# Patient Record
Sex: Male | Born: 1985 | Race: Black or African American | Hispanic: No | Marital: Single | Smoking: Current some day smoker
Health system: Southern US, Community
[De-identification: ages and names within clinical notes are randomized; demographics above are authoritative.]

## PROBLEM LIST (undated history)

## (undated) DIAGNOSIS — H409 Unspecified glaucoma: Secondary | ICD-10-CM

## (undated) DIAGNOSIS — F32A Depression, unspecified: Secondary | ICD-10-CM

## (undated) DIAGNOSIS — F4310 Post-traumatic stress disorder, unspecified: Secondary | ICD-10-CM

## (undated) HISTORY — PX: APPENDECTOMY: SHX54

---
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
## 2006-02-06 ENCOUNTER — Emergency Department: Payer: Self-pay | Admitting: Emergency Medicine

## 2006-02-06 ENCOUNTER — Other Ambulatory Visit: Payer: Self-pay

## 2006-10-31 ENCOUNTER — Emergency Department: Payer: Self-pay | Admitting: Emergency Medicine

## 2007-02-02 ENCOUNTER — Emergency Department: Payer: Self-pay | Admitting: Emergency Medicine

## 2007-04-19 ENCOUNTER — Emergency Department: Payer: Self-pay | Admitting: Emergency Medicine

## 2007-11-17 ENCOUNTER — Inpatient Hospital Stay: Payer: Self-pay | Admitting: General Surgery

## 2007-12-16 ENCOUNTER — Inpatient Hospital Stay: Payer: Self-pay | Admitting: Psychiatry

## 2008-01-21 ENCOUNTER — Emergency Department: Payer: Self-pay | Admitting: Emergency Medicine

## 2012-01-03 ENCOUNTER — Emergency Department: Payer: Self-pay | Admitting: Emergency Medicine

## 2018-07-29 ENCOUNTER — Emergency Department
Admission: EM | Admit: 2018-07-29 | Discharge: 2018-07-29 | Disposition: A | Discharge status: Home / Self Care | Payer: Self-pay | Attending: Emergency Medicine | Admitting: Emergency Medicine

## 2018-07-29 ENCOUNTER — Other Ambulatory Visit: Payer: Self-pay

## 2018-07-29 ENCOUNTER — Emergency Department: Payer: Self-pay

## 2018-07-29 DIAGNOSIS — Y999 Unspecified external cause status: Secondary | ICD-10-CM | POA: Insufficient documentation

## 2018-07-29 DIAGNOSIS — S41112A Laceration without foreign body of left upper arm, initial encounter: Secondary | ICD-10-CM | POA: Insufficient documentation

## 2018-07-29 DIAGNOSIS — Y939 Activity, unspecified: Secondary | ICD-10-CM | POA: Insufficient documentation

## 2018-07-29 DIAGNOSIS — Y929 Unspecified place or not applicable: Secondary | ICD-10-CM | POA: Insufficient documentation

## 2018-07-29 MED ORDER — TRAMADOL HCL 50 MG PO TABS
50.0000 mg | ORAL_TABLET | Freq: Once | ORAL | Status: AC
  Administered 2018-07-29: 50 mg via ORAL
  Filled 2018-07-29: qty 1

## 2018-07-29 MED ORDER — TRAMADOL HCL 50 MG PO TABS: 50.0000 mg | ORAL_TABLET | Freq: Four times a day (QID) | ORAL | 0 refills | Status: AC | PRN

## 2018-07-29 MED ORDER — LIDOCAINE HCL 1 % IJ SOLN
30.0000 mL | Freq: Once | INTRAMUSCULAR | Status: AC
  Administered 2018-07-29: 30 mL
  Filled 2018-07-29: qty 30

## 2018-07-29 MED ORDER — IOHEXOL 350 MG/ML SOLN
100.0000 mL | Freq: Once | INTRAVENOUS | Status: AC | PRN
  Administered 2018-07-29: 100 mL via INTRAVENOUS
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
## 2018-07-29 NOTE — Discharge Instructions (Addendum)
Please seek medical attention for any high fevers, chest pain, shortness of breath, change in behavior, persistent vomiting, bloody stool or any other new or concerning symptoms.  

## 2018-07-29 NOTE — ED Notes (Signed)
ED Provider at bedside. 

## 2018-07-29 NOTE — ED Notes (Signed)
Clean sterile dressing applied to wound.

## 2018-07-29 NOTE — ED Notes (Signed)
Patient transported to CT 

## 2018-07-29 NOTE — ED Notes (Signed)
Lidocaine administered by MD Derrill Kay

## 2018-07-29 NOTE — ED Notes (Signed)
Reviewed discharge instructions, follow-up care, laceration/wound care, and prescriptions with patient. Patient verbalized understanding of all information reviewed. Patient stable, with no distress noted at this time.

## 2018-07-29 NOTE — ED Notes (Signed)
Two officers remain at bedside.
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
## 2019-04-23 ENCOUNTER — Emergency Department
Admission: EM | Admit: 2019-04-23 | Discharge: 2019-04-23 | Payer: HRSA Program | Attending: Emergency Medicine | Admitting: Emergency Medicine

## 2019-04-23 ENCOUNTER — Encounter: Payer: Self-pay | Admitting: Emergency Medicine

## 2019-04-23 ENCOUNTER — Other Ambulatory Visit: Payer: Self-pay

## 2019-04-23 DIAGNOSIS — Z20828 Contact with and (suspected) exposure to other viral communicable diseases: Secondary | ICD-10-CM | POA: Diagnosis not present

## 2019-04-23 DIAGNOSIS — Z20822 Contact with and (suspected) exposure to covid-19: Secondary | ICD-10-CM

## 2019-04-23 LAB — SARS CORONAVIRUS 2 BY RT PCR (HOSPITAL ORDER, PERFORMED IN ~~LOC~~ HOSPITAL LAB): SARS Coronavirus 2: NEGATIVE

## 2019-04-23 NOTE — ED Notes (Signed)
Pt somewhat agitated at present. Officer in room

## 2019-04-23 NOTE — ED Notes (Signed)
Waiting for covid results per corrections officer
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
## 2019-04-23 NOTE — ED Provider Notes (Signed)
[facility] Emergency Department Provider Note  ____________________________________________  Time seen: Approximately 3:47 PM  I have reviewed the triage vital signs and the nursing notes.   HISTORY  Chief Complaint covid test    HPI Daniel Jacobson is a 33 y.o. male presents to the emergency department for COVID-19 testing.  Patient is currently in custody of [place] police.  Had extensive conversation with patient in exam room and states that he refused COVID testing as he is apprehensive about pain with swabbing.  Explained to patient that COVID-19 testing is only uncomfortable momentarily.  He is calm and cooperative and agreed to testing.        History reviewed. No pertinent past medical history.  There are no active problems to display for this patient.   History reviewed. No pertinent surgical history.  Prior to Admission medications   Medication Sig Start Date End Date Taking? Authorizing Provider  traMADol (ULTRAM) 50 MG tablet Take 1 tablet (50 mg total) by mouth every 6 (six) hours as needed. 07/29/18 07/29/19  Phineas Semen, MD    Allergies Patient has no known allergies.  No family history on file.  Social History Social History   Tobacco Use  . Smoking status: Not on file  Substance Use Topics  . Alcohol use: Not on file  . Drug use: Not on file     Review of Systems  Constitutional: No fever/chills Eyes: No visual changes. No discharge ENT: No upper respiratory complaints. Cardiovascular: no chest pain. Respiratory: no cough. No SOB. Gastrointestinal: No abdominal pain.  No nausea, no vomiting.  No diarrhea.  No constipation. Genitourinary: Negative for dysuria. No hematuria Musculoskeletal: Negative for musculoskeletal pain. Skin: Negative for rash, abrasions, lacerations, ecchymosis. Neurological: Negative for headaches, focal weakness or  numbness.   ____________________________________________   PHYSICAL EXAM:  VITAL SIGNS: ED Triage Vitals  Enc Vitals Group     BP 04/23/19 1507 113/63     Pulse Rate 04/23/19 1507 100     Resp 04/23/19 1507 18     Temp 04/23/19 1507 97.7 F (36.5 C)     Temp Source 04/23/19 1507 Oral     SpO2 04/23/19 1507 97 %     Weight 04/23/19 1508 160 lb (72.6 kg)     Height 04/23/19 1508 6' (1.829 m)     Head Circumference --      Peak Flow --      Pain Score --      Pain Loc --      Pain Edu? --      Excl. in [place]? --      Constitutional: Alert and oriented. Well appearing and in no acute distress. Eyes: Conjunctivae are normal. PERRL. EOMI. Head: Atraumatic. Cardiovascular: Normal rate, regular rhythm. Normal S1 and S2.  Good peripheral circulation. Respiratory: Normal respiratory effort without tachypnea or retractions. Lungs CTAB. Good air entry to the bases with no decreased or absent breath sounds. Gastrointestinal: Bowel sounds 4 quadrants. Soft and nontender to palpation. No guarding or rigidity. No palpable masses. No distention. No CVA tenderness. Musculoskeletal: Full range of motion to all extremities. No gross deformities appreciated. Neurologic:  Normal speech and language. No gross focal neurologic deficits are appreciated.  Skin:  Skin is warm, dry and intact. No rash noted. Psychiatric: Mood and affect are normal. Speech and behavior are normal. Patient exhibits appropriate insight and judgement.   ____________________________________________   LABS (all labs ordered are listed, but only abnormal results are displayed)  Labs Reviewed  SARS CORONAVIRUS 2 (HOSPITAL ORDER, [place] [facility])   ____________________________________________  EKG   ____________________________________________  RADIOLOGY   No results found.  ____________________________________________    PROCEDURES  Procedure(s) performed:     Procedures    Medications - No data to display   ____________________________________________   INITIAL IMPRESSION / ASSESSMENT AND PLAN / ED COURSE  Pertinent labs & imaging results that were available during my care of the patient were reviewed by me and considered in my medical decision making (see chart for details).  Review of the [place] CSRS was performed in accordance of the [place] prior to dispensing any controlled drugs.         Assessment and plan Need for COVID-20 testing 33 year old male presents to the emergency department with a need for COVID-19 testing.  COVID-19 testing occurred in the emergency department.  Patient is not currently symptomatic at this time and is in custody of [place] police.  Return precautions were given.  All patient questions were answered.    ____________________________________________  FINAL CLINICAL IMPRESSION(S) / ED DIAGNOSES  Final diagnoses:  Suspected Covid-19 Virus Infection      NEW MEDICATIONS STARTED DURING THIS VISIT:  ED Discharge Orders    None          This chart was dictated using voice recognition software/Dragon. Despite best efforts to proofread, errors can occur which can change the meaning. Any change was purely unintentional.    Lannie Fields, PA-C 04/23/19 1549    Carrie Mew, MD 04/27/19 [phone]
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
## 2019-06-09 ENCOUNTER — Other Ambulatory Visit: Payer: Self-pay

## 2019-06-09 ENCOUNTER — Ambulatory Visit: Payer: Self-pay | Admitting: Physician Assistant

## 2019-06-09 DIAGNOSIS — Z113 Encounter for screening for infections with a predominantly sexual mode of transmission: Secondary | ICD-10-CM

## 2019-06-09 DIAGNOSIS — Z202 Contact with and (suspected) exposure to infections with a predominantly sexual mode of transmission: Secondary | ICD-10-CM

## 2019-06-10 ENCOUNTER — Encounter: Payer: Self-pay | Admitting: Physician Assistant

## 2019-06-10 MED ORDER — METRONIDAZOLE 500 MG PO TABS: 2000.0000 mg | ORAL_TABLET | Freq: Once | ORAL | 0 refills | Status: AC

## 2019-06-10 NOTE — Progress Notes (Signed)
    STI clinic/screening visit  Subjective:  Daniel Jacobson is a 33 y.o. male being seen today for an STI screening visit. The patient reports they do have symptoms.  Patient has the following medical conditions:  There are no active problems to display for this patient.    Chief Complaint  Patient presents with  . SEXUALLY TRANSMITTED DISEASE    HPI  Patient reports that he is a contact to [place].  Denies symptoms except that he had swollen nodes a few days last week.  Declines screening exam and blood work today.  See flowsheet for further details and programmatic requirements.    The following portions of the patient's history were reviewed and updated as appropriate: allergies, current medications, past medical history, past social history, past surgical history and problem list.  Objective:  There were no vitals filed for this visit.  Physical Exam Constitutional:      General: He is not in acute distress.    Appearance: Normal appearance. He is normal weight.  HENT:     Head: Normocephalic and atraumatic.  Pulmonary:     Effort: Pulmonary effort is normal.  Neurological:     Mental Status: He is alert and oriented to person, place, and time.  Psychiatric:        Mood and Affect: Mood normal.        Behavior: Behavior normal.        Thought Content: Thought content normal.        Judgment: Judgment normal.       Assessment and Plan:  Daniel Jacobson is a 33 y.o. male presenting to the [facility] Department for STI screening  1. Screening for STD (sexually transmitted disease) Patient into clinic without current symptoms.  Declines screening exam and blood work today. Rec condoms with all sex. RTC prn.  2. Venereal disease contact Will treat as a contact to [place] with Metronidazole 2 g po with food, no EtOH for 24 hr before and until 72 hr after completing medicine. No sex for 7 days and until after partner completes treatment. RTC  for re-treatment if vomits < 2 hr after taking medicine. - metroNIDAZOLE (FLAGYL) 500 MG tablet; Take 4 tablets (2,000 mg total) by mouth once for 1 dose.  Dispense: 4 tablet; Refill: 0     No follow-ups on file.  No future appointments.  Jerene Dilling, PA

## 2019-07-17 ENCOUNTER — Other Ambulatory Visit: Payer: Self-pay

## 2019-07-17 ENCOUNTER — Emergency Department
Admission: EM | Admit: 2019-07-17 | Discharge: 2019-07-17 | Disposition: A | Discharge status: Home / Self Care | Payer: Self-pay | Attending: Emergency Medicine | Admitting: Emergency Medicine

## 2019-07-17 ENCOUNTER — Encounter: Payer: Self-pay | Admitting: Emergency Medicine

## 2019-07-17 DIAGNOSIS — F17210 Nicotine dependence, cigarettes, uncomplicated: Secondary | ICD-10-CM | POA: Insufficient documentation

## 2019-07-17 DIAGNOSIS — K029 Dental caries, unspecified: Secondary | ICD-10-CM | POA: Insufficient documentation

## 2019-07-17 DIAGNOSIS — K0889 Other specified disorders of teeth and supporting structures: Secondary | ICD-10-CM

## 2019-07-17 MED ORDER — PENICILLIN V POTASSIUM 500 MG PO TABS: 500.0000 mg | ORAL_TABLET | Freq: Four times a day (QID) | ORAL | 0 refills | Status: AC

## 2019-07-17 NOTE — ED Triage Notes (Signed)
Patient with complaint of left lower back dental pain times one week. Patient reports that he has been taking OTC medications with no relief.

## 2019-07-17 NOTE — ED Provider Notes (Signed)
[facility] Emergency Department Provider Note   ____________________________________________   First MD Initiated Contact with Patient 07/17/19 0116     (approximate)  I have reviewed the triage vital signs and the nursing notes.   HISTORY  Chief Complaint Dental Pain    HPI Daniel Jacobson is a 33 y.o. male with no significant past medical history who presents to the ED complaining of dental pain.  Patient reports he has had 3 weeks of near constant throbbing at the area of his left lower molar.  He describes some mild swelling but has not noticed any drainage from the area and denies any difficulty eating or drinking.  He has not had any difficulty opening his jaw and denies any fevers.  He is requesting the tooth be removed tonight, he has not yet seen a dentist for this issue and does not have a dentist that he follows with regularly.  He has been taking over-the-counter pain medication without relief.        History reviewed. No pertinent past medical history.  There are no problems to display for this patient.   Past Surgical History:  Procedure Laterality Date  . APPENDECTOMY      Prior to Admission medications   Medication Sig Start Date End Date Taking? Authorizing Provider  penicillin v potassium (VEETID) 500 MG tablet Take 1 tablet (500 mg total) by mouth 4 (four) times daily for 7 days. 07/17/19 07/24/19  Blake Divine, MD  traMADol (ULTRAM) 50 MG tablet Take 1 tablet (50 mg total) by mouth every 6 (six) hours as needed. 07/29/18 07/29/19  Nance Pear, MD    Allergies Patient has no known allergies.  No family history on file.  Social History Social History   Tobacco Use  . Smoking status: Current Every Day Smoker    Types: Cigarettes  . Smokeless tobacco: Never Used  Substance Use Topics  . Alcohol use: Yes  . Drug use: Not Currently    Types: Marijuana    Review of Systems  Constitutional: No  fever/chills Eyes: No visual changes. ENT: No sore throat.  Positive for dental pain. Cardiovascular: Denies chest pain. Respiratory: Denies shortness of breath. Gastrointestinal: No abdominal pain.  No nausea, no vomiting.  No diarrhea.  No constipation. Genitourinary: Negative for dysuria. Musculoskeletal: Negative for back pain. Skin: Negative for rash. Neurological: Negative for headaches, focal weakness or numbness.  ____________________________________________   PHYSICAL EXAM:  VITAL SIGNS: ED Triage Vitals [07/17/19 0048]  Enc Vitals Group     BP (!) 144/86     Pulse Rate 85     Resp 18     Temp 98.2 F (36.8 C)     Temp Source Oral     SpO2 100 %     Weight 175 lb (79.4 kg)     Height 6' (1.829 m)     Head Circumference      Peak Flow      Pain Score 10     Pain Loc      Pain Edu?      Excl. in [place]?     Constitutional: Alert and oriented. Eyes: Conjunctivae are normal. Head: Atraumatic. Nose: No congestion/rhinnorhea. Mouth/Throat: Mucous membranes are moist.  Poor dentition with dental caries noted to left lower molar and mild surrounding erythema and edema without focal area of fluctuance.  Submandibular compartments soft, no trismus. Neck: Normal ROM Cardiovascular: Normal rate, regular rhythm. Grossly normal heart sounds. Respiratory: Normal respiratory effort.  No retractions. Lungs CTAB. Gastrointestinal: Soft and nontender. No distention. Genitourinary: deferred Musculoskeletal: No lower extremity tenderness nor edema. Neurologic:  Normal speech and language. No gross focal neurologic deficits are appreciated. Skin:  Skin is warm, dry and intact. No rash noted. Psychiatric: Mood and affect are normal. Speech and behavior are normal.  ____________________________________________   LABS (all labs ordered are listed, but only abnormal results are displayed)  Labs Reviewed - No data to display   PROCEDURES  Procedure(s) performed (including  Critical Care):  Procedures   ____________________________________________   INITIAL IMPRESSION / ASSESSMENT AND PLAN / ED COURSE       33 year old male presents to the ED with 3 weeks of pain in the area of his left lower molar.  He has no trismus or significant edema, no evidence of [place]'s angina.  He also does not have any findings to suggest abscess, but there does appear to be infection developing around the left lower molar.  Patient was informed that there is no indication for tooth removal tonight here in the ED and that he will need to follow-up with a dentist.  Will prescribe antibiotics and patient was given information on options for local dentist.  Counseled to complete all antibiotics and to follow-up with dentistry, otherwise return to the ED for new or worsening symptoms.  Patient agrees with plan.      ____________________________________________   FINAL CLINICAL IMPRESSION(S) / ED DIAGNOSES  Final diagnoses:  Dental caries  Dentalgia     ED Discharge Orders         Ordered    penicillin v potassium (VEETID) 500 MG tablet  4 times daily     07/17/19 0135           Note:  This document was prepared using Dragon voice recognition software and may include unintentional dictation errors.   Chesley Noon, MD 07/17/19 [phone]

## 2019-07-17 NOTE — ED Notes (Signed)
Reviewed discharge instructions, follow-up care, and prescriptions with patient. Patient verbalized understanding of all information reviewed. Patient stable, with no distress noted at this time.    

## 2019-07-17 NOTE — ED Notes (Signed)
Patient c/o upper/lower wisdom tooth pain and swelling.

## 2019-07-17 NOTE — ED Notes (Signed)
Patient's girlfriend Pearline Cables [phone] and mother Cobey Raineri [phone].

## 2019-12-06 ENCOUNTER — Other Ambulatory Visit: Payer: Self-pay

## 2019-12-06 ENCOUNTER — Encounter: Payer: Self-pay | Admitting: Emergency Medicine

## 2019-12-06 ENCOUNTER — Emergency Department
Admission: EM | Admit: 2019-12-06 | Discharge: 2019-12-06 | Disposition: A | Discharge status: Home / Self Care | Payer: Self-pay | Attending: Emergency Medicine | Admitting: Emergency Medicine

## 2019-12-06 DIAGNOSIS — H409 Unspecified glaucoma: Secondary | ICD-10-CM | POA: Insufficient documentation

## 2019-12-06 DIAGNOSIS — F17210 Nicotine dependence, cigarettes, uncomplicated: Secondary | ICD-10-CM | POA: Insufficient documentation

## 2019-12-06 DIAGNOSIS — Z760 Encounter for issue of repeat prescription: Secondary | ICD-10-CM | POA: Insufficient documentation

## 2019-12-06 HISTORY — DX: Unspecified glaucoma: H40.9

## 2019-12-06 MED ORDER — DORZOLAMIDE HCL-TIMOLOL MAL 2-0.5 % OP SOLN: 1.0000 [drp] | Freq: Two times a day (BID) | OPHTHALMIC | 1 refills | Status: AC

## 2019-12-06 NOTE — ED Notes (Signed)
Pt states that he has glaucoma and that he hasn't taken his meds in two months. Pt states his eyes are sensitive to light and that they are sore. Pt states he also has some blurry vision.

## 2019-12-06 NOTE — ED Provider Notes (Signed)
Emergency Department Provider Note  ____________________________________________  Time seen: Approximately 9:01 PM  I have reviewed the triage vital signs and the nursing notes.   HISTORY  Chief Complaint Eye Problem   Historian Patient     HPI Daniel Jacobson is a 34 y.o. male presents to the emergency department for a refill of his glaucoma medication.  Patient states that he uses topical eye medications for his glaucoma and is running low but is not completely out.  He denies changes in vision or eye pain.  Patient states that he has some chronic photophobia but no new symptoms.  Patient states that he takes dorzolamide and timolol combination medication.  No other alleviating measures have been attempted.   Past Medical History:  Diagnosis Date  . Glaucoma      Immunizations up to date:  Yes.     Past Medical History:  Diagnosis Date  . Glaucoma     There are no problems to display for this patient.   Past Surgical History:  Procedure Laterality Date  . APPENDECTOMY      Prior to Admission medications   Medication Sig Start Date End Date Taking? Authorizing Provider  dorzolamide-timolol (COSOPT) 22.3-6.8 MG/ML ophthalmic solution Place 1 drop into both eyes 2 (two) times daily. 12/06/19 12/05/20  Orvil Feil, PA-C    Allergies Patient has no known allergies.  No family history on file.  Social History Social History   Tobacco Use  . Smoking status: Current Every Day Smoker    Types: Cigarettes  . Smokeless tobacco: Never Used  Substance Use Topics  . Alcohol use: Yes  . Drug use: Not Currently    Types: Marijuana     Review of Systems  Constitutional: No fever/chills Eyes:  No discharge ENT: No upper respiratory complaints. Respiratory: no cough. No SOB/ use of accessory muscles to breath Gastrointestinal:   No nausea, no vomiting.  No diarrhea.  No constipation. Musculoskeletal: Negative for musculoskeletal pain. Skin:  Negative for rash, abrasions, lacerations, ecchymosis.    ____________________________________________   PHYSICAL EXAM:  VITAL SIGNS: ED Triage Vitals  Enc Vitals Group     BP 12/06/19 1930 132/72     Pulse Rate 12/06/19 1930 96     Resp 12/06/19 1930 18     Temp 12/06/19 1930 98.5 F (36.9 C)     Temp Source 12/06/19 1930 Oral     SpO2 --      Weight 12/06/19 1931 175 lb (79.4 kg)     Height 12/06/19 1931 6' (1.829 m)     Head Circumference --      Peak Flow --      Pain Score 12/06/19 1931 8     Pain Loc --      Pain Edu? --      Excl. in GC? --      Constitutional: Alert and oriented. Well appearing and in no acute distress. Eyes: Conjunctivae are normal. PERRL. EOMI. Head: Atraumatic. ENT:      Nose: No congestion/rhinnorhea.      Mouth/Throat: Mucous membranes are moist.  Neck: No stridor.  No cervical spine tenderness to palpation.  Cardiovascular: Normal rate, regular rhythm. Normal S1 and S2.  Good peripheral circulation. Respiratory: Normal respiratory effort without tachypnea or retractions. Lungs CTAB. Good air entry to the bases with no decreased or absent breath sounds Gastrointestinal: Bowel sounds x 4 quadrants. Soft and nontender to palpation. No guarding or rigidity. No distention. Musculoskeletal: Full range of motion  to all extremities. No obvious deformities noted Neurologic:  Normal for age. No gross focal neurologic deficits are appreciated.  Skin:  Skin is warm, dry and intact. No rash noted. Psychiatric: Mood and affect are normal for age. Speech and behavior are normal.   ____________________________________________   LABS (all labs ordered are listed, but only abnormal results are displayed)  Labs Reviewed - No data to display ____________________________________________  EKG   ____________________________________________  RADIOLOGY   No results  found.  ____________________________________________    PROCEDURES  Procedure(s) performed:     Procedures     Medications - No data to display   ____________________________________________   INITIAL IMPRESSION / ASSESSMENT AND PLAN / ED COURSE  Pertinent labs & imaging results that were available during my care of the patient were reviewed by me and considered in my medical decision making (see chart for details).      Assessment and plan Medication refill 34 year old male presents to the emergency department for medication refill.  A refill of patients Cosopt was given during this emergency department encounter.  Patient was advised to follow-up with ophthalmology as needed.  Return precautions were given to return with new or worsening symptoms    ____________________________________________  FINAL CLINICAL IMPRESSION(S) / ED DIAGNOSES  Final diagnoses:  Medication refill      NEW MEDICATIONS STARTED DURING THIS VISIT:  ED Discharge Orders         Ordered    dorzolamide-timolol (COSOPT) 22.3-6.8 MG/ML ophthalmic solution  2 times daily     12/06/19 2105              This chart was dictated using voice recognition software/Dragon. Despite best efforts to proofread, errors can occur which can change the meaning. Any change was purely unintentional.     Karren Cobble 12/06/19 2112    Nance Pear, MD 12/06/19 2122

## 2019-12-06 NOTE — ED Triage Notes (Addendum)
Patient ambulatory to triage with steady gait, without difficulty or distress noted, mask in place; bilat eye pain & sensitivity; st ran out of his glaucoma medicine 69mos ago due to be incarcerated; pt also c/o right shoulder pain since getting an injury in prison; pain with ROM; visual acuity 20/30 rt eye 20/25 left eye

## 2020-01-09 ENCOUNTER — Emergency Department (HOSPITAL_COMMUNITY): Payer: Self-pay

## 2020-01-09 ENCOUNTER — Encounter (HOSPITAL_COMMUNITY): Payer: Self-pay | Admitting: Emergency Medicine

## 2020-01-09 ENCOUNTER — Emergency Department (HOSPITAL_COMMUNITY)
Admission: EM | Admit: 2020-01-09 | Discharge: 2020-01-09 | Disposition: A | Discharge status: Home / Self Care | Payer: Self-pay | Attending: Emergency Medicine | Admitting: Emergency Medicine

## 2020-01-09 DIAGNOSIS — T07XXXA Unspecified multiple injuries, initial encounter: Secondary | ICD-10-CM

## 2020-01-09 DIAGNOSIS — S51812A Laceration without foreign body of left forearm, initial encounter: Secondary | ICD-10-CM | POA: Insufficient documentation

## 2020-01-09 DIAGNOSIS — F17210 Nicotine dependence, cigarettes, uncomplicated: Secondary | ICD-10-CM | POA: Insufficient documentation

## 2020-01-09 DIAGNOSIS — W2209XA Striking against other stationary object, initial encounter: Secondary | ICD-10-CM | POA: Insufficient documentation

## 2020-01-09 DIAGNOSIS — Y929 Unspecified place or not applicable: Secondary | ICD-10-CM | POA: Insufficient documentation

## 2020-01-09 DIAGNOSIS — Y9339 Activity, other involving climbing, rappelling and jumping off: Secondary | ICD-10-CM | POA: Insufficient documentation

## 2020-01-09 DIAGNOSIS — Y999 Unspecified external cause status: Secondary | ICD-10-CM | POA: Insufficient documentation

## 2020-01-09 HISTORY — DX: Post-traumatic stress disorder, unspecified: F43.10

## 2020-01-09 HISTORY — DX: Depression, unspecified: F32.A

## 2020-01-09 LAB — BASIC METABOLIC PANEL
Anion gap: 17 — ABNORMAL HIGH (ref 5–15)
BUN: 5 mg/dL — ABNORMAL LOW (ref 6–20)
CO2: 19 mmol/L — ABNORMAL LOW (ref 22–32)
Calcium: 9.2 mg/dL (ref 8.9–10.3)
Chloride: 102 mmol/L (ref 98–111)
Creatinine, Ser: 0.85 mg/dL (ref 0.61–1.24)
GFR calc Af Amer: >60 mL/min (ref >60)
GFR calc non Af Amer: >60 mL/min (ref >60)
Glucose, Bld: 109 mg/dL — ABNORMAL HIGH (ref 70–99)
Potassium: 3.1 mmol/L — ABNORMAL LOW (ref 3.5–5.1)
Sodium: 138 mmol/L (ref 135–145)

## 2020-01-09 LAB — CBC
HCT: 45.6 % (ref 39.0–52.0)
Hemoglobin: 15.9 g/dL (ref 13.0–17.0)
MCH: 33.5 pg (ref 26.0–34.0)
MCHC: 34.9 g/dL (ref 30.0–36.0)
MCV: 96.2 fL (ref 80.0–100.0)
Platelets: 153 10*3/uL (ref 150–400)
RBC: 4.74 MIL/uL (ref 4.22–5.81)
RDW: 11.7 % (ref 11.5–15.5)
WBC: 6.9 10*3/uL (ref 4.0–10.5)
nRBC: 0 % (ref 0.0–0.2)

## 2020-01-09 MED ORDER — CEFAZOLIN SODIUM-DEXTROSE 2-4 GM/100ML-% IV SOLN
2.0000 g | Freq: Once | INTRAVENOUS | Status: AC
  Administered 2020-01-09: 2 g via INTRAVENOUS

## 2020-01-09 MED ORDER — LIDOCAINE-EPINEPHRINE (PF) 2 %-1:200000 IJ SOLN
INTRAMUSCULAR | Status: AC
  Filled 2020-01-09: qty 20

## 2020-01-09 MED ORDER — AMOXICILLIN-POT CLAVULANATE 875-125 MG PO TABS
1.0000 | ORAL_TABLET | Freq: Two times a day (BID) | ORAL | 0 refills | Status: DC
Start: 2020-01-09 — End: 2020-01-09

## 2020-01-09 MED ORDER — AMOXICILLIN-POT CLAVULANATE 875-125 MG PO TABS: 1.0000 | ORAL_TABLET | Freq: Two times a day (BID) | ORAL | 0 refills | Status: AC

## 2020-01-09 NOTE — ED Notes (Signed)
Pt to ED via [facility] after reported being chased by the police and trying to jump a fence.  Pt cut his left forearm on wire on top of fence.  Police dept placed a tourniquet on left upper arm.  EMS st's arm continued to bleed and they applied more dressing to arm at 18:25.  Bleeding controlled on arrival to ED.  EMS also reports pt was uncooperative enroute and they gave him Haldol 10mg  IV and Versed 2mg  IV.  Pt alert and oriented on arrival to ED and c/o severe pain to left arm.

## 2020-01-09 NOTE — ED Notes (Signed)
Dr. Mack Hook at bedside suturing wound

## 2020-01-09 NOTE — ED Provider Notes (Signed)
[facility] EMERGENCY DEPARTMENT Provider Note   CSN: [id number] Arrival date & time: 01/09/20  1845     History No chief complaint on file.   Daniel Jacobson is a 34 y.o. male.  34 year old male who presents with lacerations.  Just prior to arrival, the patient was being chased by police and jumped over a fence, sustaining a large laceration to his left forearm as well as multiple other small lacerations elsewhere.  A CAT tourniquet was placed by police on L forearm for bleeding, taken down then replaced again by EMS. VSS during transport. Pt's primary complaint is severe pain from tourniquet. He states tdap UTD.  The history is provided by the patient and the police.       Past Medical History:  Diagnosis Date  . Depression   . PTSD (post-traumatic stress disorder)     There are no problems to display for this patient.   History reviewed. No pertinent surgical history.     No family history on file.  Social History   Tobacco Use  . Smoking status: Current Some Day Smoker  . Smokeless tobacco: Never Used  Substance Use Topics  . Alcohol use: Yes  . Drug use: Never    Home Medications Prior to Admission medications   Medication Sig Start Date End Date Taking? Authorizing Provider  amoxicillin-clavulanate (AUGMENTIN) 875-125 MG tablet Take 1 tablet by mouth 2 (two) times daily for 4 days. 01/09/20 01/13/20  Mack Hook, MD    Allergies    Patient has no allergy information on record. denies Review of Systems   Review of Systems All other systems reviewed and are negative except that which was mentioned in HPI  Physical Exam Updated Vital Signs BP (!) 134/97   Pulse 90   Temp 97.7 F (36.5 C) (Oral)   Resp 15   Ht 6' (1.829 m)   Wt 79.4 kg   SpO2 94%   BMI 23.73 kg/m   Physical Exam Vitals and nursing note reviewed.  Constitutional:      General: He is not in acute distress.    Appearance: He is well-developed.      Comments: anxious  HENT:     Head: Normocephalic and atraumatic.  Eyes:     Conjunctiva/sclera: Conjunctivae normal.  Cardiovascular:     Rate and Rhythm: Normal rate and regular rhythm.     Heart sounds: Normal heart sounds. No murmur heard.   Pulmonary:     Effort: Pulmonary effort is normal.     Breath sounds: Normal breath sounds.  Abdominal:     General: There is no distension.     Palpations: Abdomen is soft.     Tenderness: There is no abdominal tenderness.  Musculoskeletal:        General: Normal range of motion.     Cervical back: Neck supple.  Skin:    General: Skin is warm and dry.     Comments: Large 10cm laceration on volar L forearm with muscle exposure, a few small venules actively bleeding but no pulsatile bleeding, CAT tourniquet on proximal L arm, scattered smaller lacerations on L upper arm, R upper arm, L forearm; scattered abrasions and superficial small lacs on anterior chest and L lateral side none of which are deep  Neurological:     Mental Status: He is alert and oriented to person, place, and time.     Comments: Fluent speech  Psychiatric:        Judgment: Judgment  normal.         ED Results / Procedures / Treatments   Labs (all labs ordered are listed, but only abnormal results are displayed) Labs Reviewed  BASIC METABOLIC PANEL - Abnormal; Notable for the following components:      Result Value   Potassium 3.1 (*)    CO2 19 (*)    Glucose, Bld 109 (*)    BUN 5 (*)    Anion gap 17 (*)    All other components within normal limits  SARS CORONAVIRUS 2 BY RT PCR (HOSPITAL ORDER, PERFORMED IN [facility])  CBC    EKG None  Radiology DG Forearm Left  Result Date: 01/09/2020 CLINICAL DATA:  Deep laceration to forearm after jumping over fence. EXAM: LEFT FOREARM - 2 VIEW COMPARISON:  None. FINDINGS: Soft tissue laceration with overlying bandage material noted involving the medial aspect of the proximal forearm. The underlying  osseous structures are intact. No fracture or dislocation. IMPRESSION: 1. No acute bone abnormality. 2. Soft tissue laceration involving the medial aspect of the proximal forearm. Electronically Signed   By: Signa Kell M.D.   On: 01/09/2020 19:43    Procedures .Marland KitchenLaceration Repair  Date/Time: 01/09/2020 8:04 PM Performed by: Laurence Spates, MD Authorized by: Laurence Spates, MD   Consent:    Consent obtained:  Verbal   Consent given by:  Patient Anesthesia (see MAR for exact dosages):    Anesthesia method:  None Laceration details:    Location:  Shoulder/arm   Shoulder/arm location:  L lower arm   Length (cm):  10 Repair type:    Repair type:  Intermediate Subcutaneous repair:    Suture size:  4-0   Suture material:  Vicryl   Suture technique:  Figure eight   Number of sutures:  2 Post-procedure details:    Dressing:  Bulky dressing   Patient tolerance of procedure:  Tolerated well, no immediate complications Comments:     Placed 2 subcutaneous figure-of-eight sutures to stop small areas of bleeding; hemostasis acheived   (including critical care time)  Medications Ordered in ED Medications  lidocaine-EPINEPHrine (XYLOCAINE W/EPI) 2 %-1:200000 (PF) injection (has no administration in time range)  ceFAZolin (ANCEF) IVPB 2g/100 mL premix (2 g Intravenous New Bag/Given 01/09/20 2042)    ED Course  I have reviewed the triage vital signs and the nursing notes.  Pertinent labs & imaging results that were available during my care of the patient were reviewed by me and considered in my medical decision making (see chart for details).    MDM Rules/Calculators/A&P                          Pt alert, in distress due to pain from tourniquet. VSS. On exam, large laceration on volar L forearm w/ venous bleeding but no obvious arterial injury. Placed a few vicryl sutures for hemostasis then wrapped in combat gauze w/ compressive dressing. 2+ radial pulse after CAT  tourniquet taken down. Pt states tdap UTD. Forearm XR negative for foreign body or bony injury.    Given extent of wound, contacted hand surgeon on call, Dr. Janee Morn. I appreciate his assistance with the patient's care. He repaired wound at bedside. Has given ancef here and prescribed augmentin at discharge. I discussed wound care w/ the patient and return precautions regarding signs/sx of infection. He voiced understanding. Pt discharged to police custody.  Final Clinical Impression(s) / ED Diagnoses Final diagnoses:  None    Rx / DC Orders ED Discharge Orders         Ordered    amoxicillin-clavulanate (AUGMENTIN) 875-125 MG tablet  2 times daily     Discontinue  Reprint     01/09/20 2109           Lyzette Reinhardt, Wenda Overland, MD 01/09/20 2118

## 2020-01-09 NOTE — Progress Notes (Signed)
Orthopedic Tech Progress Note Patient Details:  Daniel Jacobson February 22, 1986 [id number] Level 1 trauma downgraded to level 2 Patient ID: Daniel Jacobson, male   DOB: Jun 06, 1986, 34 y.o.   MRN: [id number]   [name] 01/09/2020, 7:17 PM

## 2020-01-09 NOTE — Discharge Instructions (Addendum)
I would like for him to be on Augmentin, 875 BID for 4 days Keep bandages on wounds for 48 hours.    Then, 1. may d/c dressings and only need to dress if wounds are draining. 2. May cleanse wounds with soap and running water in shower 3. D/c staples in 10-15 days if wound appears appropriate.  If he is no longer in custody, then we are happy to remove them at our office.  Call for appt.

## 2020-01-09 NOTE — Consult Note (Signed)
ORTHOPAEDIC CONSULTATION HISTORY & PHYSICAL REQUESTING PHYSICIAN: Little, Wenda Overland, MD  Chief Complaint: L FA lacerations  HPI: Daniel Jacobson is a 34 y.o. male who was reportedly eluding police when he sustained multiple lacerations from a fence.  He was initially evaluated as a level 1 trauma then downgraded.  Hemostasis has been obtained.  I was consulted for management of the larger left volar forearm laceration.  Past Medical History:  Diagnosis Date  . Depression   . PTSD (post-traumatic stress disorder)    History reviewed. No pertinent surgical history. Social History   Socioeconomic History  . Marital status: Single    Spouse name: Not on file  . Number of children: Not on file  . Years of education: Not on file  . Highest education level: Not on file  Occupational History  . Not on file  Tobacco Use  . Smoking status: Current Some Day Smoker  . Smokeless tobacco: Never Used  Substance and Sexual Activity  . Alcohol use: Yes  . Drug use: Never  . Sexual activity: Not on file  Other Topics Concern  . Not on file  Social History Narrative  . Not on file   Social Determinants of Health   Financial Resource Strain:   . Difficulty of Paying Living Expenses:   Food Insecurity:   . Worried About Charity fundraiser in the Last Year:   . Arboriculturist in the Last Year:   Transportation Needs:   . Film/video editor (Medical):   Marland Kitchen Lack of Transportation (Non-Medical):   Physical Activity:   . Days of Exercise per Week:   . Minutes of Exercise per Session:   Stress:   . Feeling of Stress :   Social Connections:   . Frequency of Communication with Friends and Family:   . Frequency of Social Gatherings with Friends and Family:   . Attends Religious Services:   . Active Member of Clubs or Organizations:   . Attends Archivist Meetings:   Marland Kitchen Marital Status:    No family history on file. Not on File Prior to Admission medications     Not on File   DG Forearm Left  Result Date: 01/09/2020 CLINICAL DATA:  Deep laceration to forearm after jumping over fence. EXAM: LEFT FOREARM - 2 VIEW COMPARISON:  None. FINDINGS: Soft tissue laceration with overlying bandage material noted involving the medial aspect of the proximal forearm. The underlying osseous structures are intact. No fracture or dislocation. IMPRESSION: 1. No acute bone abnormality. 2. Soft tissue laceration involving the medial aspect of the proximal forearm. Electronically Signed   By: Kerby Moors M.D.   On: 01/09/2020 19:43    Positive ROS: All other systems have been reviewed and were otherwise negative with the exception of those mentioned in the HPI and as above.  Physical Exam: Vitals: Refer to EMR. Constitutional:  WD, WN, NAD, resting with eyes closed, but will follow commands HEENT:  NCAT, EOMI Neuro/Psych: Following commands Lymphatic: No generalized extremity edema or lymphadenopathy Extremities / MSK:  The extremities are normal with respect to appearance, ranges of motion, joint stability, muscle strength/tone, sensation, & perfusion except as otherwise noted:  There is a linear laceration on the volar aspect of the proximal forearm on the left.  It is a slight Y-shaped, measuring about 15 cm of laceration total.  It is through the skin and subcutaneous tissues, and one portion of it has breached the fascia exposing the underlying  muscle which appears clean, red and well perfused and contractile.  The wound itself is fairly clean.  There is a couple of adjacent lacerations measuring 2 cm each proximal to this that only extend in the subcutaneous plane, as well as a similar one on the posterior aspect of the upper arm, about 3 cm.  Altogether there is 22 cm.   FDS and FDP are intact to all the digits to testing, as is FPL.  He retains wrist flexion extension.  Intact light touch sensibility on the volar aspect of the small finger, the index finger, and the  dorsal first webspace.  Assessment: Large relatively shallow lacerations of the left upper extremity, without significant muscular injury  Plan/Procedure: I reviewed these findings with him and gained verbal consent to proceed with local anesthesia, wound irrigation and closure.  Lidocaine with epinephrine was instilled at the periphery of the wounds, 20 mL total.  Once adequately anesthetized, the wounds were copiously irrigated with saline and a touch of peroxide.  The wounds were then prepped with Betadine and draped and some jagged skin edges of the major wound were debrided sharply with scissors and forceps.  The wounds were then closed with staples.  A light dressing was applied after the skin of the arm was cleaned of dried blood, etc. He reports his tetanus is up-to-date.  I ensured he received 2 g of Ancef in the ED.  It is my understanding he will be discharged in police custody to the magistrate, and beyond that is unclear.  I would like for him to be on 4 days of Augmentin if possible and the staples can be removed in 10 to 15 days of the wounds appear appropriate.  If he is no longer in custody, were happy to do so in the office.  I have written such in his discharge instructions.  Cliffton Asters Janee Morn, MD      Orthopaedic & Hand Surgery [facility] [address] [place], [place]  [address] Office: [phone] Mobile: [phone]  01/09/2020, 9:06 PM

## 2020-01-10 ENCOUNTER — Encounter: Payer: Self-pay | Admitting: Emergency Medicine

## 2020-07-31 ENCOUNTER — Emergency Department: Payer: HRSA Program

## 2020-07-31 ENCOUNTER — Emergency Department
Admission: EM | Admit: 2020-07-31 | Discharge: 2020-07-31 | Discharge status: Against Medical Advice | Payer: HRSA Program | Attending: Emergency Medicine | Admitting: Emergency Medicine

## 2020-07-31 ENCOUNTER — Other Ambulatory Visit: Payer: Self-pay

## 2020-07-31 DIAGNOSIS — Z9049 Acquired absence of other specified parts of digestive tract: Secondary | ICD-10-CM | POA: Insufficient documentation

## 2020-07-31 DIAGNOSIS — R1110 Vomiting, unspecified: Secondary | ICD-10-CM | POA: Diagnosis present

## 2020-07-31 DIAGNOSIS — U071 COVID-19: Secondary | ICD-10-CM | POA: Insufficient documentation

## 2020-07-31 DIAGNOSIS — F17200 Nicotine dependence, unspecified, uncomplicated: Secondary | ICD-10-CM | POA: Insufficient documentation

## 2020-07-31 DIAGNOSIS — R112 Nausea with vomiting, unspecified: Secondary | ICD-10-CM | POA: Insufficient documentation

## 2020-07-31 DIAGNOSIS — E860 Dehydration: Secondary | ICD-10-CM | POA: Insufficient documentation

## 2020-07-31 LAB — COMPREHENSIVE METABOLIC PANEL
ALT: 34 U/L (ref 0–44)
AST: 34 U/L (ref 15–41)
Albumin: 4.5 g/dL (ref 3.5–5.0)
Alkaline Phosphatase: 52 U/L (ref 38–126)
Anion gap: 11 (ref 5–15)
BUN: 9 mg/dL (ref 6–20)
CO2: 31 mmol/L (ref 22–32)
Calcium: 9.7 mg/dL (ref 8.9–10.3)
Chloride: 90 mmol/L — ABNORMAL LOW (ref 98–111)
Creatinine, Ser: 1.08 mg/dL (ref 0.61–1.24)
GFR, Estimated: >60 mL/min (ref >60)
Glucose, Bld: 119 mg/dL — ABNORMAL HIGH (ref 70–99)
Potassium: 3.6 mmol/L (ref 3.5–5.1)
Sodium: 132 mmol/L — ABNORMAL LOW (ref 135–145)
Total Bilirubin: 2.2 mg/dL — ABNORMAL HIGH (ref 0.3–1.2)
Total Protein: 8.2 g/dL — ABNORMAL HIGH (ref 6.5–8.1)

## 2020-07-31 LAB — CBC
HCT: 53.5 % — ABNORMAL HIGH (ref 39.0–52.0)
Hemoglobin: 19.4 g/dL — ABNORMAL HIGH (ref 13.0–17.0)
MCH: 33.3 pg (ref 26.0–34.0)
MCHC: 36.3 g/dL — ABNORMAL HIGH (ref 30.0–36.0)
MCV: 91.9 fL (ref 80.0–100.0)
Platelets: 142 10*3/uL — ABNORMAL LOW (ref 150–400)
RBC: 5.82 MIL/uL — ABNORMAL HIGH (ref 4.22–5.81)
RDW: 12.4 % (ref 11.5–15.5)
WBC: 5.8 10*3/uL (ref 4.0–10.5)
nRBC: 0 % (ref 0.0–0.2)

## 2020-07-31 LAB — POC SARS CORONAVIRUS 2 AG -  ED: SARS Coronavirus 2 Ag: POSITIVE — AB

## 2020-07-31 LAB — LIPASE, BLOOD: Lipase: 40 U/L (ref 11–51)

## 2020-07-31 MED ORDER — ONDANSETRON HCL 4 MG/2ML IJ SOLN
4.0000 mg | Freq: Once | INTRAMUSCULAR | Status: AC
  Administered 2020-07-31: 4 mg via INTRAVENOUS
  Filled 2020-07-31: qty 2

## 2020-07-31 MED ORDER — SODIUM CHLORIDE 0.9 % IV BOLUS
1000.0000 mL | Freq: Once | INTRAVENOUS | Status: AC
  Administered 2020-07-31: 1000 mL via INTRAVENOUS

## 2020-07-31 MED ORDER — ACETAMINOPHEN 325 MG PO TABS
650.0000 mg | ORAL_TABLET | Freq: Once | ORAL | Status: AC
  Administered 2020-07-31: 650 mg via ORAL
  Filled 2020-07-31: qty 2

## 2020-07-31 MED ORDER — LIDOCAINE VISCOUS HCL 2 % MT SOLN
15.0000 mL | Freq: Once | OROMUCOSAL | Status: AC
  Administered 2020-07-31: 15 mL via ORAL
  Filled 2020-07-31: qty 15

## 2020-07-31 MED ORDER — ALUM & MAG HYDROXIDE-SIMETH 200-200-20 MG/5ML PO SUSP
30.0000 mL | Freq: Once | ORAL | Status: AC
  Administered 2020-07-31: 30 mL via ORAL
  Filled 2020-07-31: qty 30

## 2020-07-31 MED ORDER — ALUM & MAG HYDROXIDE-SIMETH 200-200-20 MG/5ML PO SUSP: 15.0000 mL | Freq: Four times a day (QID) | ORAL | 0 refills | Status: AC | PRN

## 2020-07-31 MED ORDER — FAMOTIDINE 20 MG PO TABS: 20.0000 mg | ORAL_TABLET | Freq: Two times a day (BID) | ORAL | 0 refills | Status: AC

## 2020-07-31 MED ORDER — ONDANSETRON HCL 4 MG PO TABS: 4.0000 mg | ORAL_TABLET | Freq: Every day | ORAL | 0 refills | Status: AC | PRN

## 2020-07-31 MED ORDER — FAMOTIDINE IN NACL 20-0.9 MG/50ML-% IV SOLN
20.0000 mg | Freq: Once | INTRAVENOUS | Status: AC
  Administered 2020-07-31: 20 mg via INTRAVENOUS
  Filled 2020-07-31: qty 50

## 2020-07-31 MED ORDER — LIDOCAINE HCL (PF) 1 % IJ SOLN
5.0000 mL | Freq: Once | INTRAMUSCULAR | Status: AC
  Administered 2020-07-31: 5 mL via INTRADERMAL
  Filled 2020-07-31: qty 5

## 2020-07-31 NOTE — ED Provider Notes (Signed)
Due to difficulty with obtaining IV access, a 20G peripheral IV catheter was inserted using US guidance into the LUE.  The site was prepped with chlorhexidine and allowed to dry.  The patient tolerated the procedure without any complications.    Daniel Eddy, MD 07/31/20 Flossie Buffy

## 2020-07-31 NOTE — ED Provider Notes (Signed)
[facility] Emergency Department Provider Note  ____________________________________________  Time seen: Approximately 5:16 PM  I have reviewed the triage vital signs and the nursing notes.   HISTORY  Chief Complaint No chief complaint on file.    HPI Daniel Jacobson is a 35 y.o. male that presents to the emergency department for evaluation of vomiting for 4 days.  Vomiting started the morning after New Year's Eve.  Patient states that he drank half a gallon of bottom shelf vodka.  Patient has had trouble keeping down fluids and food since.  He has been sipping on Gatorade today though.  He has had some rhinorrhea. No sick contacts. No known fevers, cough, shortness of breath, chest pain, diarrhea.  Past Medical History:  Diagnosis Date   Depression    Glaucoma    PTSD (post-traumatic stress disorder)     There are no problems to display for this patient.   Past Surgical History:  Procedure Laterality Date   APPENDECTOMY      Prior to Admission medications   Medication Sig Start Date End Date Taking? Authorizing Provider  alum & mag hydroxide-simeth (MYLANTA) 200-200-20 MG/5ML suspension Take 15 mLs by mouth every 6 (six) hours as needed for indigestion or heartburn. 07/31/20  Yes Enid Derry, PA-C  famotidine (PEPCID) 20 MG tablet Take 1 tablet (20 mg total) by mouth 2 (two) times daily for 10 days. 07/31/20 08/10/20 Yes Enid Derry, PA-C  ondansetron (ZOFRAN) 4 MG tablet Take 1 tablet (4 mg total) by mouth daily as needed for nausea or vomiting. 07/31/20 07/31/21 Yes Enid Derry, PA-C  dorzolamide-timolol (COSOPT) 22.3-6.8 MG/ML ophthalmic solution Place 1 drop into both eyes 2 (two) times daily. 12/06/19 12/05/20  Orvil Feil, PA-C    Allergies Patient has no known allergies.  No family history on file.  Social History Social History   Tobacco Use   Smoking status: Current Some Day Smoker   Smokeless tobacco: Never Used   Substance Use Topics   Alcohol use: Yes   Drug use: Never    Types: Marijuana     Review of Systems  Constitutional: No fever/chills ENT: No upper respiratory complaints. Cardiovascular: No chest pain. Respiratory: No cough. No SOB. Gastrointestinal: No abdominal pain.  Positive for nausea and vomiting. Musculoskeletal: Negative for musculoskeletal pain. Skin: Negative for rash, abrasions, lacerations, ecchymosis. Neurological: Negative for headaches, numbness or tingling   ____________________________________________   PHYSICAL EXAM:  VITAL SIGNS: ED Triage Vitals  Enc Vitals Group     BP 07/31/20 1428 (!) 124/98     Pulse Rate 07/31/20 1428 (!) 106     Resp 07/31/20 1428 16     Temp 07/31/20 1428 100 F (37.8 C)     Temp Source 07/31/20 1428 Oral     SpO2 07/31/20 1428 98 %     Weight --      Height --      Head Circumference --      Peak Flow --      Pain Score 07/31/20 1421 0     Pain Loc --      Pain Edu? --      Excl. in GC? --      Constitutional: Alert and oriented. Well appearing and in no acute distress. Eyes: Conjunctivae are normal. PERRL. EOMI. Head: Atraumatic. ENT:      Ears:      Nose: No congestion/rhinnorhea.      Mouth/Throat: Mucous membranes are moist.  Neck: No stridor.  Cardiovascular: Normal rate, regular rhythm.  Good peripheral circulation. Respiratory: Normal respiratory effort without tachypnea or retractions. Lungs CTAB. Good air entry to the bases with no decreased or absent breath sounds. Gastrointestinal: Bowel sounds 4 quadrants. Soft and nontender to palpation. No guarding or rigidity. No palpable masses. No distention.  Musculoskeletal: Full range of motion to all extremities. No gross deformities appreciated. Neurologic:  Normal speech and language. No gross focal neurologic deficits are appreciated.  Skin:  Skin is warm, dry and intact. No rash noted. Psychiatric: Mood and affect are normal. Speech and behavior are  normal. Patient exhibits appropriate insight and judgement.   ____________________________________________   LABS (all labs ordered are listed, but only abnormal results are displayed)  Labs Reviewed  CBC - Abnormal; Notable for the following components:      Result Value   RBC 5.82 (*)    Hemoglobin 19.4 (*)    HCT 53.5 (*)    MCHC 36.3 (*)    Platelets 142 (*)    All other components within normal limits  COMPREHENSIVE METABOLIC PANEL - Abnormal; Notable for the following components:   Sodium 132 (*)    Chloride 90 (*)    Glucose, Bld 119 (*)    Total Protein 8.2 (*)    Total Bilirubin 2.2 (*)    All other components within normal limits  POC SARS CORONAVIRUS 2 AG -  ED - Abnormal; Notable for the following components:   SARS Coronavirus 2 Ag POSITIVE (*)    All other components within normal limits  LIPASE, BLOOD   ____________________________________________  EKG   ____________________________________________  RADIOLOGY   DG Chest 1 View  Result Date: 07/31/2020 CLINICAL DATA:  Vomiting, COVID-19 positive EXAM: CHEST  1 VIEW COMPARISON:  02/06/2006 FINDINGS: 2 frontal views of the chest demonstrate an unremarkable cardiac silhouette. No airspace disease, effusion, or pneumothorax. No acute bony abnormalities. IMPRESSION: 1. No acute intrathoracic process. Electronically Signed   By: Sharlet Salina M.D.   On: 07/31/2020 19:07    ____________________________________________    PROCEDURES  Procedure(s) performed:    Procedures    Medications  sodium chloride 0.9 % bolus 1,000 mL (0 mLs Intravenous Stopped 07/31/20 1830)  ondansetron (ZOFRAN) injection 4 mg (4 mg Intravenous Given 07/31/20 1716)  alum & mag hydroxide-simeth (MAALOX/MYLANTA) 200-200-20 MG/5ML suspension 30 mL (30 mLs Oral Given 07/31/20 1627)    And  lidocaine (XYLOCAINE) 2 % viscous mouth solution 15 mL (15 mLs Oral Given 07/31/20 1627)  lidocaine (PF) (XYLOCAINE) 1 % injection 5 mL (5 mLs  Intradermal Given by Other 07/31/20 1655)  famotidine (PEPCID) IVPB 20 mg premix (0 mg Intravenous Stopped 07/31/20 1902)  acetaminophen (TYLENOL) tablet 650 mg (650 mg Oral Given 07/31/20 1829)     ____________________________________________   INITIAL IMPRESSION / ASSESSMENT AND PLAN / ED COURSE  Pertinent labs & imaging results that were available during my care of the patient were reviewed by me and considered in my medical decision making (see chart for details).  Review of the [place] CSRS was performed in accordance of the [facility] prior to dispensing any controlled drugs.   Patient presents emergency department for evaluation of vomiting for 4 days.  His Covid test is positive.  He does appear slightly dehydrated on his lab work.  Patient was given IV fluids, IV Zofran, IV Pepcid and a GI cocktail in the emergency department.  ----------------------------------------- 6:47 PM on 07/31/2020 -----------------------------------------  Patient states that he does not understand why he is  still here.  He feels completely better now.  He has "so much energy" following the fluids.  He has had 2 full glasses of water, some ice cream, popsicle and now some crackers.  ----------------------------------------- 6:52 PM on 07/31/2020 -----------------------------------------  Patient is now yelling that he is "fixed" and isn't waiting for signout. He does not want to wait for his chest x-ray results because "there is nothing wrong with his chest." He states that he is better and he is leaving.  Return precautions were given and prescriptions for his nausea and vomiting were sent.   Daniel Jacobson was evaluated in Emergency Department on 08/01/2020 for the symptoms described in the history of present illness. He was evaluated in the context of the global COVID-19 pandemic, which necessitated consideration that the patient might be at risk for infection with the SARS-CoV-2 virus that causes COVID-19.  Institutional protocols and algorithms that pertain to the evaluation of patients at risk for COVID-19 are in a state of rapid change based on information released by regulatory bodies including the CDC and federal and state organizations. These policies and algorithms were followed during the patient's care in the ED.   ____________________________________________  FINAL CLINICAL IMPRESSION(S) / ED DIAGNOSES  Final diagnoses:  COVID-19  Nausea and vomiting, intractability of vomiting not specified, unspecified vomiting type      NEW MEDICATIONS STARTED DURING THIS VISIT:  ED Discharge Orders         Ordered    famotidine (PEPCID) 20 MG tablet  2 times daily        07/31/20 1845    ondansetron (ZOFRAN) 4 MG tablet  Daily PRN        07/31/20 1845    alum & mag hydroxide-simeth (MYLANTA) 200-200-20 MG/5ML suspension  Every 6 hours PRN        07/31/20 1857              This chart was dictated using voice recognition software/Dragon. Despite best efforts to proofread, errors can occur which can change the meaning. Any change was purely unintentional.    Laban Emperor, PA-C 08/01/20 1035    Merlyn Lot, MD 08/05/20 [phone]

## 2020-07-31 NOTE — ED Notes (Signed)
This Rn and Writer unable to obtain IV access at this time. IV team consult

## 2020-07-31 NOTE — ED Notes (Signed)
Pt heard screaming and yelling in room, this RN to bedside with Daniel Sheldon RN to answer call bell, pt states he is yelling at his mother not staff and he wants to leave

## 2020-07-31 NOTE — ED Notes (Signed)
Pt did not d/c signature at this time. Pt upset and left but with prescription medication.

## 2020-07-31 NOTE — ED Triage Notes (Signed)
Pt comes via POV from home with c/o vomiting. Pt states unable to keep anything down.
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
## 2021-06-05 ENCOUNTER — Emergency Department
Admission: EM | Admit: 2021-06-05 | Discharge: 2021-06-05 | Disposition: A | Discharge status: Home / Self Care | Payer: Self-pay | Attending: Emergency Medicine | Admitting: Emergency Medicine

## 2021-06-05 ENCOUNTER — Other Ambulatory Visit: Payer: Self-pay

## 2021-06-05 ENCOUNTER — Encounter: Payer: Self-pay | Admitting: Emergency Medicine

## 2021-06-05 DIAGNOSIS — F17200 Nicotine dependence, unspecified, uncomplicated: Secondary | ICD-10-CM | POA: Insufficient documentation

## 2021-06-05 DIAGNOSIS — S71101A Unspecified open wound, right thigh, initial encounter: Secondary | ICD-10-CM | POA: Insufficient documentation

## 2021-06-05 DIAGNOSIS — W3400XD Accidental discharge from unspecified firearms or gun, subsequent encounter: Secondary | ICD-10-CM

## 2021-06-05 DIAGNOSIS — Z23 Encounter for immunization: Secondary | ICD-10-CM | POA: Insufficient documentation

## 2021-06-05 DIAGNOSIS — Z760 Encounter for issue of repeat prescription: Secondary | ICD-10-CM | POA: Insufficient documentation

## 2021-06-05 DIAGNOSIS — X58XXXA Exposure to other specified factors, initial encounter: Secondary | ICD-10-CM | POA: Insufficient documentation

## 2021-06-05 MED ORDER — HYDROCODONE-ACETAMINOPHEN 5-325 MG PO TABS
1.0000 | ORAL_TABLET | Freq: Four times a day (QID) | ORAL | 0 refills | Status: AC | PRN
Start: 2021-06-05 — End: 2021-06-10

## 2021-06-05 MED ORDER — TIMOLOL HEMIHYDRATE 0.5 % OP SOLN: 1.0000 [drp] | Freq: Two times a day (BID) | OPHTHALMIC | 0 refills | Status: AC

## 2021-06-05 MED ORDER — OXYCODONE-ACETAMINOPHEN 5-325 MG PO TABS
1.0000 | ORAL_TABLET | Freq: Once | ORAL | Status: AC
  Administered 2021-06-05: 1 via ORAL
  Filled 2021-06-05: qty 1

## 2021-06-05 MED ORDER — TETANUS-DIPHTH-ACELL PERTUSSIS 5-2.5-18.5 LF-MCG/0.5 IM SUSY
0.5000 mL | PREFILLED_SYRINGE | Freq: Once | INTRAMUSCULAR | Status: AC
  Administered 2021-06-05: 0.5 mL via INTRAMUSCULAR
  Filled 2021-06-05: qty 0.5
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
## 2021-08-18 ENCOUNTER — Other Ambulatory Visit: Payer: Self-pay

## 2021-08-18 ENCOUNTER — Encounter: Payer: Self-pay | Admitting: Emergency Medicine

## 2021-08-18 ENCOUNTER — Emergency Department
Admission: EM | Admit: 2021-08-18 | Discharge: 2021-08-18 | Disposition: A | Discharge status: Home / Self Care | Payer: Self-pay | Attending: Emergency Medicine | Admitting: Emergency Medicine

## 2021-08-18 DIAGNOSIS — Z7689 Persons encountering health services in other specified circumstances: Secondary | ICD-10-CM

## 2021-08-18 DIAGNOSIS — Z0279 Encounter for issue of other medical certificate: Secondary | ICD-10-CM | POA: Insufficient documentation

## 2021-08-18 DIAGNOSIS — R519 Headache, unspecified: Secondary | ICD-10-CM | POA: Insufficient documentation

## 2021-08-18 NOTE — ED Triage Notes (Signed)
Pt up to front desk prior to checking in. Pt requesting a note for today. Pt states he was here with his mom this morning and his work was asking for a note that he was with her. RN looked up pts mom, pt mom last seen in October. Pt advised we would not be able to proved him with a note for work. Pt upset and walked out the door. Pt then back up to counter stating he would check in to get one. RN explained to pt that if the note was needed for yesterday we would not be able to back date from the date he was seen. Pt states he needs a note to take to work.

## 2021-08-18 NOTE — ED Triage Notes (Signed)
Pt reports HA all morning.

## 2021-08-18 NOTE — ED Provider Notes (Signed)
[facility] Provider Note  Patient Contact: 4:44 PM (approximate)   History   Headache   HPI  Daniel Jacobson is a 36 y.o. male presents to the emergency department for a return to work evaluation.  Patient has no medical complaints.      Physical Exam   Triage Vital Signs: ED Triage Vitals  Enc Vitals Group     BP 08/18/21 1604 (!) 137/91     Pulse Rate 08/18/21 1604 100     Resp 08/18/21 1604 18     Temp 08/18/21 1604 98 F (36.7 C)     Temp src --      SpO2 08/18/21 1604 100 %     Weight 08/18/21 1601 160 lb (72.6 kg)     Height 08/18/21 1601 6' (1.829 m)     Head Circumference --      Peak Flow --      Pain Score 08/18/21 1601 8     Pain Loc --      Pain Edu? --      Excl. in GC? --     Most recent vital signs: Vitals:   08/18/21 1604  BP: (!) 137/91  Pulse: 100  Resp: 18  Temp: 98 F (36.7 C)  SpO2: 100%     General: Alert and in no acute distress. Eyes:  PERRL. EOMI. Head: No acute traumatic findings ENT:      Ears:       Nose: No congestion/rhinnorhea.      Mouth/Throat: Mucous membranes are moist. Neck: No stridor. No cervical spine tenderness to palpation. Cardiovascular:  Good peripheral perfusion Respiratory: Normal respiratory effort without tachypnea or retractions. Lungs CTAB. Good air entry to the bases with no decreased or absent breath sounds. Gastrointestinal: Bowel sounds 4 quadrants. Soft and nontender to palpation. No guarding or rigidity. No palpable masses. No distention. No CVA tenderness. Musculoskeletal: Full range of motion to all extremities.  Neurologic:  No gross focal neurologic deficits are appreciated.  Skin:   No rash noted Other:   ED Results / Procedures / Treatments   Labs (all labs ordered are listed, but only abnormal results are displayed) Labs Reviewed - No data to display     RADIOLOGY  I personally viewed and evaluated these images as part of my medical decision  making, as well as reviewing the written report by the radiologist.  ED Provider Interpretation:    PROCEDURES:  Critical Care performed: No  Procedures   MEDICATIONS ORDERED IN ED: Medications - No data to display   IMPRESSION / MDM / ASSESSMENT AND PLAN / ED COURSE  I reviewed the triage vital signs and the nursing notes.                              Differential diagnosis includes, but is not limited to, return to work evaluation.   Assessment and plan 36 year old male presents to the emergency department for a return to work note.  Patient was given a work note.  All patient questions were answered.      FINAL CLINICAL IMPRESSION(S) / ED DIAGNOSES   Final diagnoses:  Return to work evaluation     Rx / DC Orders   ED Discharge Orders     None        Note:  This document was prepared using Dragon voice recognition software and may include unintentional dictation errors.  Pia Mau [place], Cordelia Poche 08/18/21 1652    Chesley Noon, MD 08/18/21 1728
